# Patient Record
Sex: Male | Born: 1990 | Race: White | Hispanic: No | Marital: Single | Smoking: Never smoker
Health system: Southern US, Community
[De-identification: ages and names within clinical notes are randomized; demographics above are authoritative.]

---

## 2017-03-12 ENCOUNTER — Encounter: Payer: Self-pay | Admitting: Emergency Medicine

## 2017-03-12 ENCOUNTER — Emergency Department
Admission: EM | Admit: 2017-03-12 | Discharge: 2017-03-12 | Disposition: A | Discharge status: Home / Self Care | Payer: 59 | Attending: Emergency Medicine | Admitting: Emergency Medicine

## 2017-03-12 ENCOUNTER — Other Ambulatory Visit: Payer: Self-pay

## 2017-03-12 ENCOUNTER — Emergency Department: Payer: 59

## 2017-03-12 DIAGNOSIS — R1110 Vomiting, unspecified: Secondary | ICD-10-CM | POA: Diagnosis present

## 2017-03-12 DIAGNOSIS — R1011 Right upper quadrant pain: Secondary | ICD-10-CM | POA: Diagnosis not present

## 2017-03-12 LAB — COMPREHENSIVE METABOLIC PANEL
ALT: 37 U/L (ref 17–63)
AST: 38 U/L (ref 15–41)
Albumin: 5.4 g/dL — ABNORMAL HIGH (ref 3.5–5.0)
Alkaline Phosphatase: 83 U/L (ref 38–126)
Anion gap: 10 (ref 5–15)
BUN: 21 mg/dL — ABNORMAL HIGH (ref 6–20)
CO2: 26 mmol/L (ref 22–32)
Calcium: 10.4 mg/dL — ABNORMAL HIGH (ref 8.9–10.3)
Chloride: 101 mmol/L (ref 101–111)
Creatinine, Ser: 1.16 mg/dL (ref 0.61–1.24)
GFR calc Af Amer: >60 mL/min (ref >60)
GFR calc non Af Amer: >60 mL/min (ref >60)
Glucose, Bld: 116 mg/dL — ABNORMAL HIGH (ref 65–99)
Potassium: 4.2 mmol/L (ref 3.5–5.1)
Sodium: 137 mmol/L (ref 135–145)
Total Bilirubin: 1.4 mg/dL — ABNORMAL HIGH (ref 0.3–1.2)
Total Protein: 9.1 g/dL — ABNORMAL HIGH (ref 6.5–8.1)

## 2017-03-12 LAB — CBC
HCT: 50.4 % (ref 40.0–52.0)
Hemoglobin: 17.5 g/dL (ref 13.0–18.0)
MCH: 30.7 pg (ref 26.0–34.0)
MCHC: 34.7 g/dL (ref 32.0–36.0)
MCV: 88.5 fL (ref 80.0–100.0)
PLATELETS: 295 10*3/uL (ref 150–440)
RBC: 5.69 MIL/uL (ref 4.40–5.90)
RDW: 14.2 % (ref 11.5–14.5)
WBC: 8.3 10*3/uL (ref 3.8–10.6)

## 2017-03-12 LAB — LIPASE, BLOOD: Lipase: 27 U/L (ref 11–51)

## 2017-03-12 MED ORDER — ONDANSETRON HCL 4 MG/2ML IJ SOLN
4.0000 mg | Freq: Once | INTRAMUSCULAR | Status: AC | PRN
  Administered 2017-03-12: 4 mg via INTRAVENOUS

## 2017-03-12 MED ORDER — ONDANSETRON HCL 4 MG/2ML IJ SOLN
4.0000 mg | Freq: Once | INTRAMUSCULAR | Status: AC
  Administered 2017-03-12: 4 mg via INTRAVENOUS
  Filled 2017-03-12: qty 2

## 2017-03-12 MED ORDER — KETOROLAC TROMETHAMINE 30 MG/ML IJ SOLN
30.0000 mg | Freq: Once | INTRAMUSCULAR | Status: AC
  Administered 2017-03-12: 30 mg via INTRAVENOUS
  Filled 2017-03-12: qty 1

## 2017-03-12 MED ORDER — FAMOTIDINE IN NACL 20-0.9 MG/50ML-% IV SOLN
20.0000 mg | Freq: Once | INTRAVENOUS | Status: AC
  Administered 2017-03-12: 20 mg via INTRAVENOUS
  Filled 2017-03-12: qty 50

## 2017-03-12 MED ORDER — ONDANSETRON 4 MG PO TBDP: 4.0000 mg | ORAL_TABLET | Freq: Three times a day (TID) | ORAL | 0 refills | Status: AC | PRN

## 2017-03-12 MED ORDER — FAMOTIDINE 20 MG PO TABS: 20.0000 mg | ORAL_TABLET | Freq: Two times a day (BID) | ORAL | 1 refills | Status: AC

## 2017-03-12 MED ORDER — SODIUM CHLORIDE 0.9 % IV SOLN
Freq: Once | INTRAVENOUS | Status: AC
  Administered 2017-03-12: 13:00:00 via INTRAVENOUS

## 2017-03-12 MED ORDER — ONDANSETRON HCL 4 MG/2ML IJ SOLN
INTRAMUSCULAR | Status: AC
  Administered 2017-03-12: 4 mg via INTRAVENOUS
  Filled 2017-03-12: qty 2

## 2017-03-12 NOTE — ED Notes (Signed)
Patient transported to Ultrasound 

## 2017-03-12 NOTE — ED Notes (Signed)
Patient given saltine crackers and sprite.

## 2017-03-12 NOTE — ED Provider Notes (Signed)
[facility] Emergency Department Provider Note       Time seen: ----------------------------------------- 12:51 PM on 03/12/2017 -----------------------------------------   I have reviewed the triage vital signs and the nursing notes.  HISTORY   Chief Complaint Emesis    HPI Hulan Amatoyler Arseneault is a 26 y.o. male with no significant past medical history who presents to the ED for acute onset of vomiting that started about 30 minutes ago.  He was also having severe right upper quadrant pain at the same time.  He presented pale, diaphoretic with persistent vomiting.  He denies any fevers, chills or diarrhea.  He has never had this happen before.  History reviewed. No pertinent past medical history.  There are no active problems to display for this patient.   History reviewed. No pertinent surgical history.  Allergies Patient has no known allergies.  Social History Social History   Tobacco Use  . Smoking status: Never Smoker  . Smokeless tobacco: Never Used  Substance Use Topics  . Alcohol use: No    Frequency: Never  . Drug use: No    Review of Systems Constitutional: Negative for fever. Cardiovascular: Negative for chest pain. Respiratory: Negative for shortness of breath. Gastrointestinal: Positive for abdominal pain, positive for vomiting Musculoskeletal: Negative for back pain. Skin: Negative for rash. Neurological: Negative for headaches, focal weakness or numbness.  All systems negative/normal/unremarkable except as stated in the HPI  ____________________________________________   PHYSICAL EXAM:  VITAL SIGNS: ED Triage Vitals  Enc Vitals Group     BP 03/12/17 1146 134/84     Pulse Rate 03/12/17 1146 88     Resp 03/12/17 1146 20     Temp --      Temp src --      SpO2 03/12/17 1146 99 %     Weight 03/12/17 1146 160 lb (72.6 kg)     Height 03/12/17 1146 5\' 10"  (1.778 m)     Head Circumference --      Peak Flow --      Pain  Score 03/12/17 1145 10     Pain Loc --      Pain Edu? --      Excl. in GC? --     Constitutional: Alert and oriented. Well appearing and in no distress. Eyes: Conjunctivae are normal. Normal extraocular movements. ENT   Head: Normocephalic and atraumatic.   Nose: No congestion/rhinnorhea.   Mouth/Throat: Mucous membranes are moist.   Neck: No stridor. Cardiovascular: Normal rate, regular rhythm. No murmurs, rubs, or gallops. Respiratory: Normal respiratory effort without tachypnea nor retractions. Breath sounds are clear and equal bilaterally. No wheezes/rales/rhonchi. Gastrointestinal: Mild right upper quadrant tenderness, no rebound or guarding.  Normal bowel sounds. Musculoskeletal: Nontender with normal range of motion in extremities. No lower extremity tenderness nor edema. Neurologic:  Normal speech and language. No gross focal neurologic deficits are appreciated.  Skin:  Skin is warm, dry and intact. No rash noted. Psychiatric: Mood and affect are normal. Speech and behavior are normal.  ___________________________________________  ED COURSE:  Pertinent labs & imaging results that were available during my care of the patient were reviewed by me and considered in my medical decision making (see chart for details). Patient presents for abdominal pain and vomiting, we will assess with labs and imaging as indicated.   Procedures ____________________________________________   LABS (pertinent positives/negatives)  Labs Reviewed  COMPREHENSIVE METABOLIC PANEL - Abnormal; Notable for the following components:      Result Value  Glucose, Bld 116 (*)    BUN 21 (*)    Calcium 10.4 (*)    Total Protein 9.1 (*)    Albumin 5.4 (*)    Total Bilirubin 1.4 (*)    All other components within normal limits  LIPASE, BLOOD  CBC  URINALYSIS, COMPLETE (UACMP) WITH MICROSCOPIC    RADIOLOGY Images were viewed by me  Right upper quadrant  ultrasound  IMPRESSION: Unremarkable right upper quadrant ultrasound. ____________________________________________  DIFFERENTIAL DIAGNOSIS   Gastroenteritis, biliary colic, renal colic, dehydration, electrolyte abnormality  FINAL ASSESSMENT AND PLAN  Vomiting, abdominal pain   Plan: Patient had presented for vomiting and right upper quadrant abdominal pain. Patient's labs revealed only mild dehydration. Patient's imaging was reassuring.  Unclear etiology for his vomiting today, likely viral.  He will be discharged with antiemetics and antacids.   Emily FilbertWilliams, Trevino Wyatt E, MD   Note: This note was generated in part or whole with voice recognition software. Voice recognition is usually quite accurate but there are transcription errors that can and very often do occur. I apologize for any typographical errors that were not detected and corrected.     Emily FilbertWilliams, Ashrita Chrismer E, MD 03/12/17 1420

## 2017-03-12 NOTE — ED Notes (Signed)
N/V improved.  Skin warm and dry.  NAD

## 2017-03-12 NOTE — ED Triage Notes (Addendum)
Pt to ed with c/o vomiting, acute onset that started about 30 minutes ago.  Pt also reports pain to right upper quad, severe. Pt pale, diaphoretic, appears in acute distress. IV started and labs drawn and sent. Pt continues to vomit.

## 2018-01-10 IMAGING — US US ABDOMEN LIMITED
1 series · 14 of 25 positions shown · non-contrast
Comparison: None.

CLINICAL DATA: Right upper quadrant pain, nausea, and vomiting
today.

EXAM:
ULTRASOUND ABDOMEN LIMITED RIGHT UPPER QUADRANT

[Series 1: us abdomen limited · 0.20mm/px · 14 of 48 slices shown]
[im 1/48]
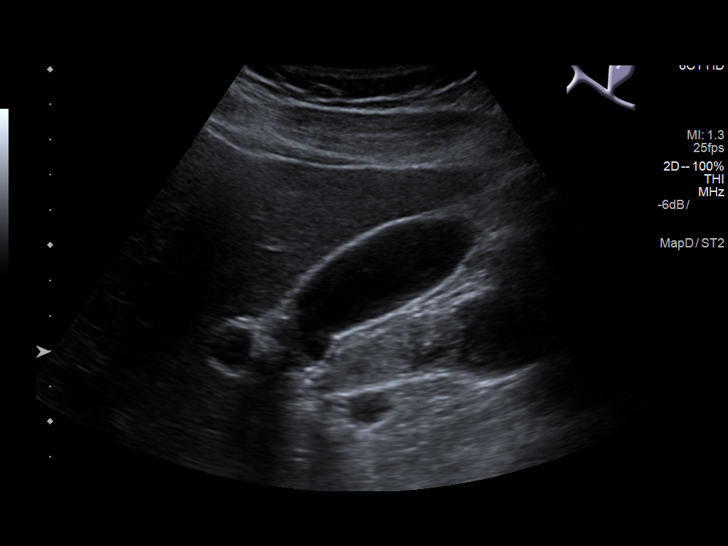
[im 4/48]
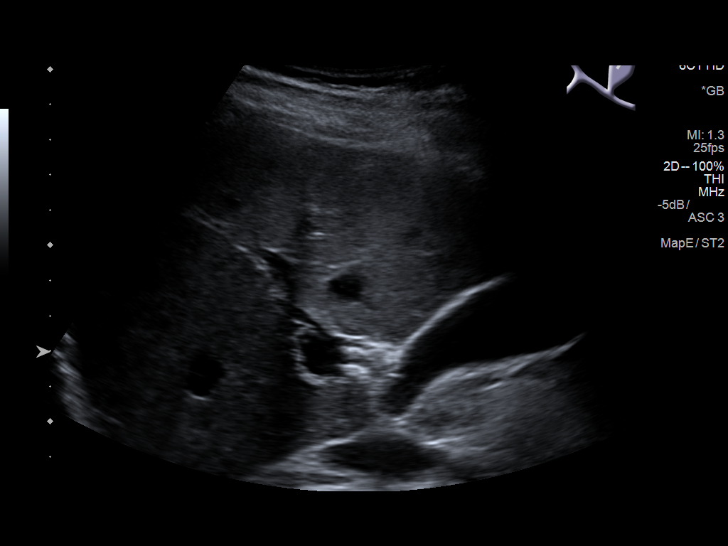
[im 8/48]
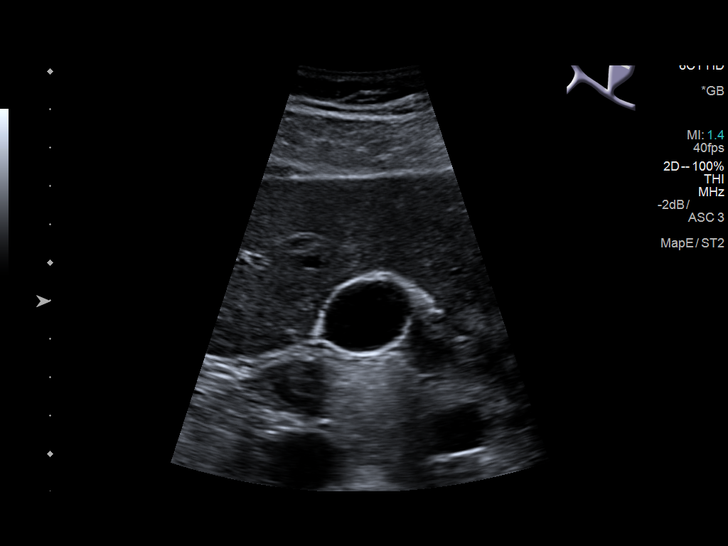
[im 12/48]
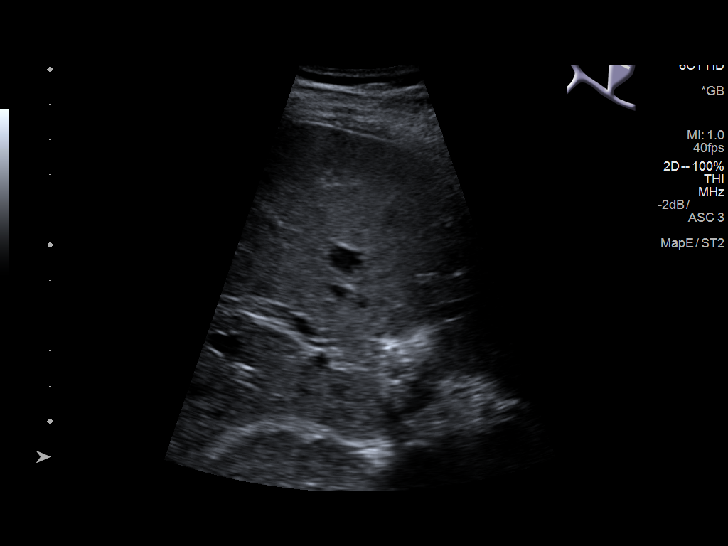
[im 16/48]
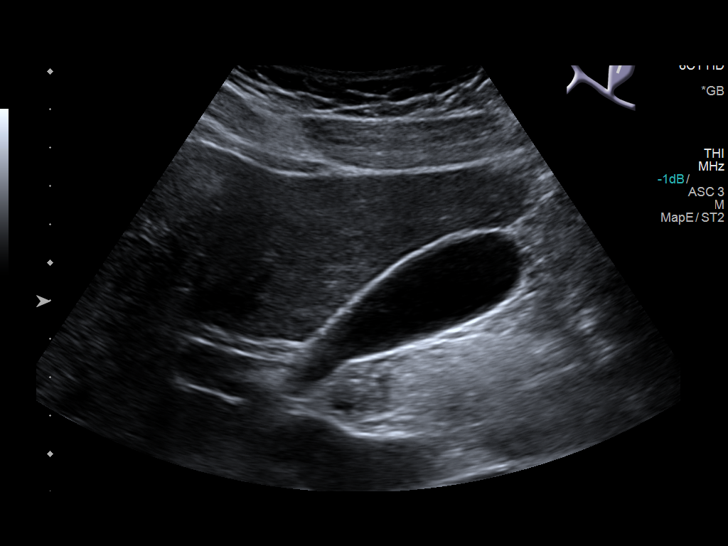
[im 18/48]
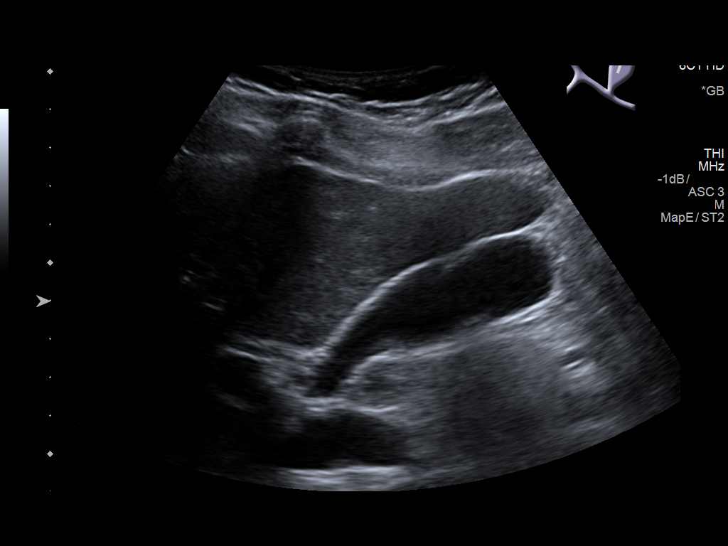
[im 22/48]
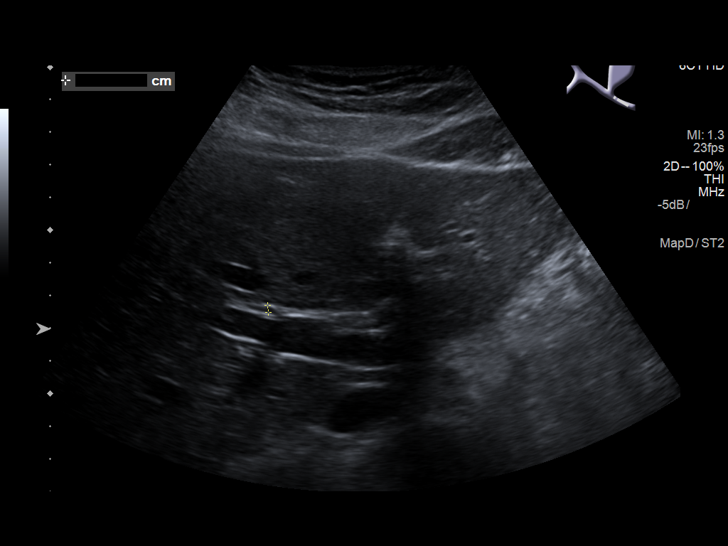
[im 26/48]
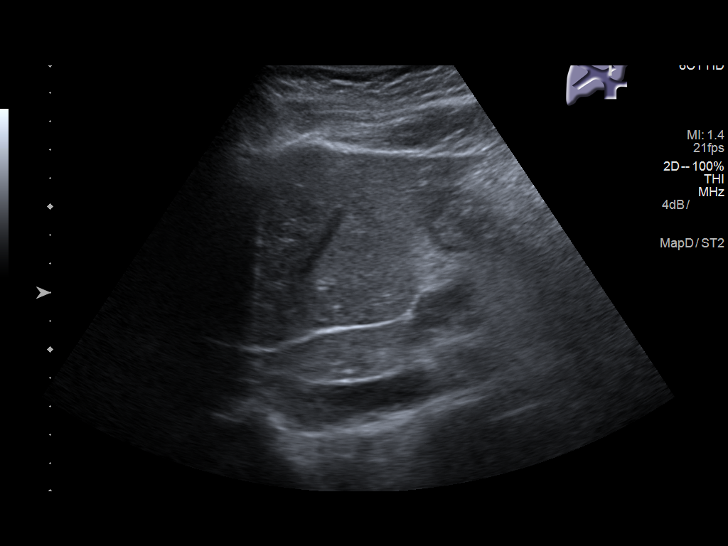
[im 30/48]
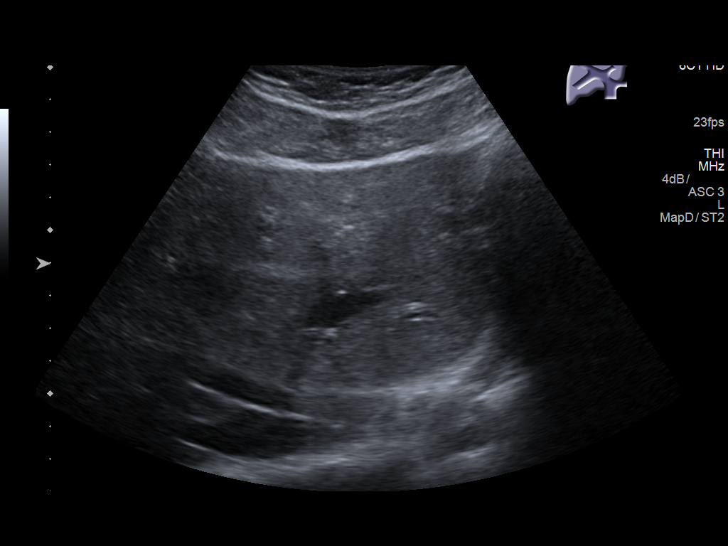
[im 32/48]
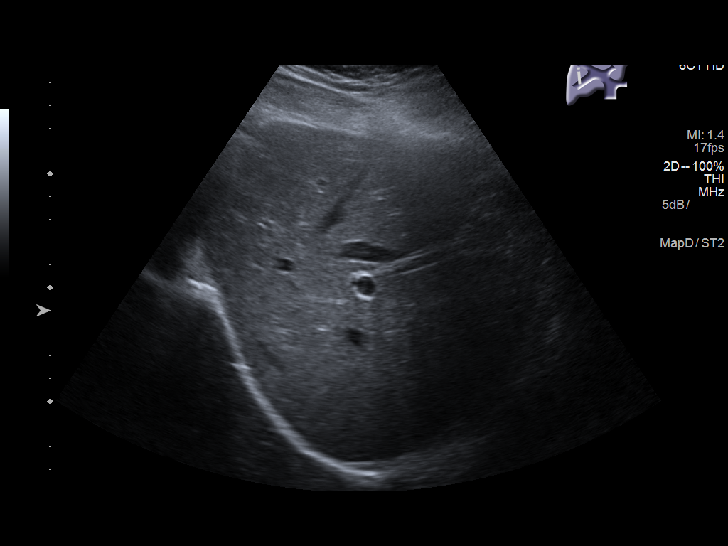
[im 36/48]
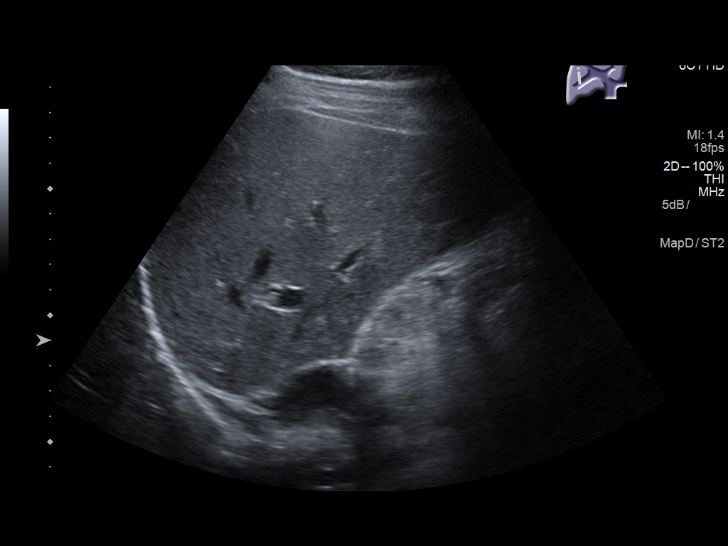
[im 40/48]
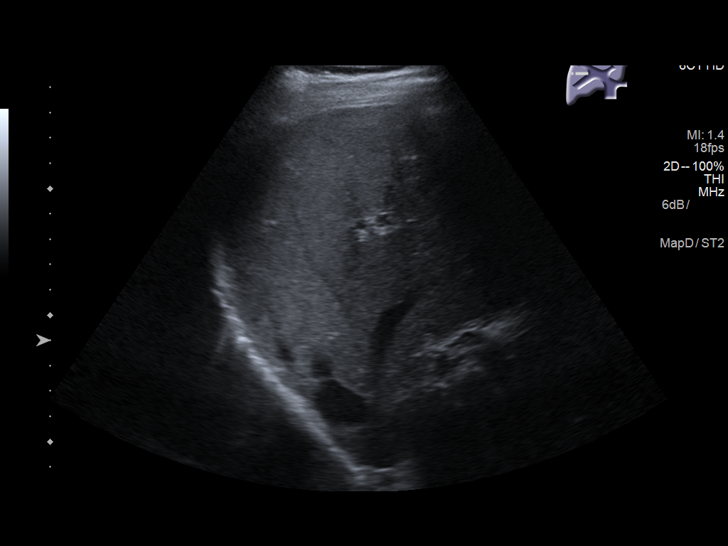
[im 44/48]
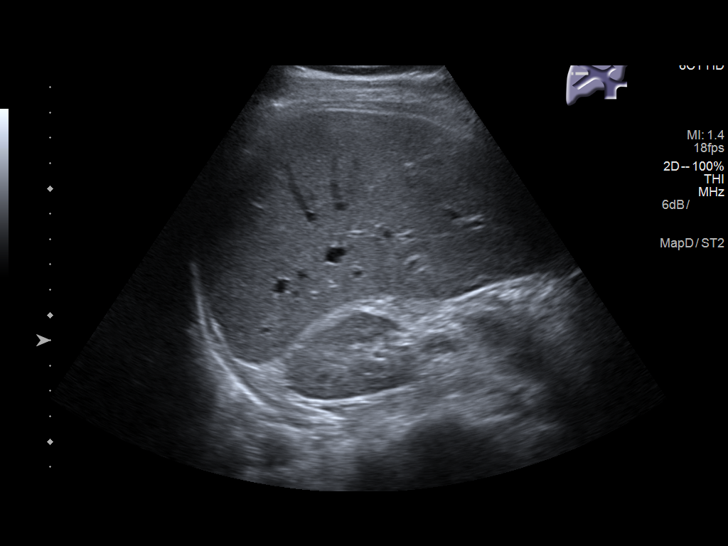
[im 48/48]
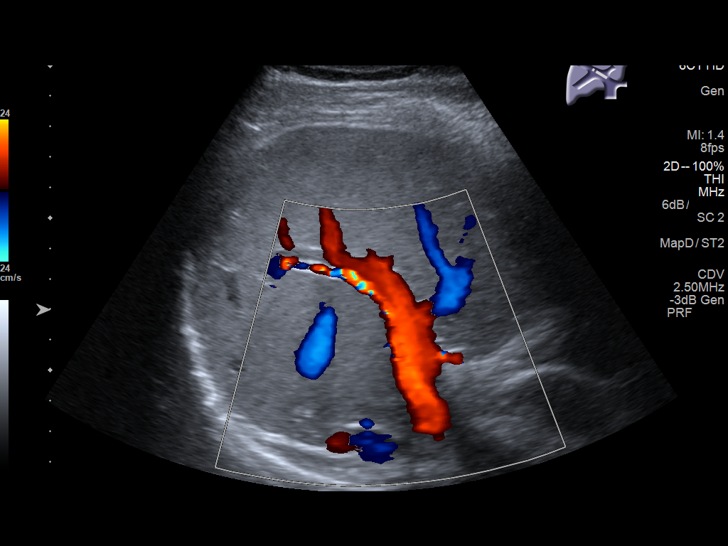

[14 of 25 positions shown; findings below may reference images not displayed]

FINDINGS: Gallbladder:

No gallstones or wall thickening visualized. No sonographic Murphy
sign noted by sonographer.

Common bile duct:

Diameter: 2 mm

Liver:

No focal lesion identified. Within normal limits in parenchymal
echogenicity. Portal vein is patent on color Doppler imaging with
normal direction of blood flow towards the liver.
IMPRESSION: Unremarkable right upper quadrant ultrasound.
